# Patient Record
Sex: Female | Born: 1990 | Race: White | Hispanic: No | Marital: Single
Health system: Midwestern US, Community
[De-identification: ages and names within clinical notes are randomized; demographics above are authoritative.]

## PROBLEM LIST (undated history)

## (undated) DIAGNOSIS — D649 Anemia, unspecified: Secondary | ICD-10-CM

## (undated) HISTORY — PX: OVARIAN CYST REMOVAL: SHX89

---

## 2015-02-13 ENCOUNTER — Emergency Department
Admit: 2015-02-13 | Disposition: A | Discharge status: Home / Self Care | Payer: Self-pay | Admitting: Emergency Medicine

## 2015-07-08 ENCOUNTER — Emergency Department: Payer: No Typology Code available for payment source

## 2015-07-08 ENCOUNTER — Encounter: Payer: Self-pay | Admitting: Emergency Medicine

## 2015-07-08 ENCOUNTER — Emergency Department
Admission: EM | Admit: 2015-07-08 | Discharge: 2015-07-09 | Disposition: A | Discharge status: Home / Self Care | Payer: No Typology Code available for payment source | Attending: Emergency Medicine | Admitting: Emergency Medicine

## 2015-07-08 DIAGNOSIS — Y998 Other external cause status: Secondary | ICD-10-CM | POA: Insufficient documentation

## 2015-07-08 DIAGNOSIS — Y9389 Activity, other specified: Secondary | ICD-10-CM | POA: Diagnosis not present

## 2015-07-08 DIAGNOSIS — Z23 Encounter for immunization: Secondary | ICD-10-CM | POA: Insufficient documentation

## 2015-07-08 DIAGNOSIS — S199XXA Unspecified injury of neck, initial encounter: Secondary | ICD-10-CM | POA: Diagnosis not present

## 2015-07-08 DIAGNOSIS — Y92410 Unspecified street and highway as the place of occurrence of the external cause: Secondary | ICD-10-CM | POA: Diagnosis not present

## 2015-07-08 DIAGNOSIS — Z720 Tobacco use: Secondary | ICD-10-CM | POA: Diagnosis not present

## 2015-07-08 DIAGNOSIS — S4992XA Unspecified injury of left shoulder and upper arm, initial encounter: Secondary | ICD-10-CM | POA: Diagnosis present

## 2015-07-08 DIAGNOSIS — M795 Residual foreign body in soft tissue: Secondary | ICD-10-CM

## 2015-07-08 DIAGNOSIS — Z3202 Encounter for pregnancy test, result negative: Secondary | ICD-10-CM | POA: Insufficient documentation

## 2015-07-08 DIAGNOSIS — S80251A Superficial foreign body, right knee, initial encounter: Secondary | ICD-10-CM | POA: Insufficient documentation

## 2015-07-08 DIAGNOSIS — S40012A Contusion of left shoulder, initial encounter: Secondary | ICD-10-CM | POA: Insufficient documentation

## 2015-07-08 HISTORY — DX: Anemia, unspecified: D64.9

## 2015-07-08 LAB — POCT PREGNANCY, URINE: Preg Test, Ur: NEGATIVE

## 2015-07-08 MED ORDER — IBUPROFEN 800 MG PO TABS
800.0000 mg | ORAL_TABLET | Freq: Once | ORAL | Status: AC
  Administered 2015-07-08: 800 mg via ORAL
  Filled 2015-07-08: qty 1

## 2015-07-08 MED ORDER — TETANUS-DIPHTH-ACELL PERTUSSIS 5-2.5-18.5 LF-MCG/0.5 IM SUSP
0.5000 mL | Freq: Once | INTRAMUSCULAR | Status: AC
  Administered 2015-07-08: 0.5 mL via INTRAMUSCULAR
  Filled 2015-07-08: qty 0.5

## 2015-07-08 MED ORDER — IBUPROFEN 800 MG PO TABS: 800.0000 mg | ORAL_TABLET | Freq: Three times a day (TID) | ORAL | Status: AC | PRN

## 2015-07-08 MED ORDER — ONDANSETRON 4 MG PO TBDP
4.0000 mg | ORAL_TABLET | Freq: Once | ORAL | Status: AC
  Administered 2015-07-08: 4 mg via ORAL
  Filled 2015-07-08: qty 1

## 2015-07-08 MED ORDER — MORPHINE SULFATE (PF) 2 MG/ML IV SOLN
2.0000 mg | Freq: Once | INTRAVENOUS | Status: AC
  Administered 2015-07-08: 2 mg via INTRAMUSCULAR
  Filled 2015-07-08: qty 1

## 2015-07-08 MED ORDER — CEPHALEXIN 500 MG PO CAPS: 500.0000 mg | ORAL_CAPSULE | Freq: Three times a day (TID) | ORAL | Status: AC

## 2015-07-08 MED ORDER — OXYCODONE-ACETAMINOPHEN 5-325 MG PO TABS: 1.0000 | ORAL_TABLET | Freq: Four times a day (QID) | ORAL | Status: AC | PRN

## 2015-07-08 MED ORDER — MORPHINE SULFATE (PF) 2 MG/ML IV SOLN
2.0000 mg | Freq: Once | INTRAVENOUS | Status: DC
Start: 2015-07-08 — End: 2015-07-08

## 2015-07-08 MED ORDER — CYCLOBENZAPRINE HCL 10 MG PO TABS: 10.0000 mg | ORAL_TABLET | Freq: Three times a day (TID) | ORAL | Status: AC | PRN

## 2015-07-08 MED ORDER — CEPHALEXIN 500 MG PO CAPS
500.0000 mg | ORAL_CAPSULE | Freq: Once | ORAL | Status: AC
  Administered 2015-07-08: 500 mg via ORAL
  Filled 2015-07-08: qty 1
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
## 2020-11-17 DIAGNOSIS — O219 Vomiting of pregnancy, unspecified: Secondary | ICD-10-CM

## 2020-11-17 NOTE — ED Notes (Signed)
Patient ambulatory to triage with c/o N/V for the past two weeks as well as a cough. Patient states she is [redacted] weeks pregnant.

## 2020-11-17 NOTE — ED Provider Notes (Signed)
30 year old female presenting for persistent nausea and vomiting.  Reports being [redacted] weeks pregnant.  She is also had a couple days of nasal congestion and cough but otherwise feels well.  She is not vaccinated for COVID.  She tells me that Zofran has worked for her in the past but she has been shot away from it because of some studies regarding birth defects.    The history is provided by the patient and the spouse.   Vomiting   This is a new problem. The current episode started more than 1 week ago. The problem occurs 5 to 10 times per day. The problem has not changed since onset.The emesis has an appearance of stomach contents. There has been no fever. Associated symptoms include cough and URI. Risk factors include ill contacts.   Nausea   Associated symptoms include cough and URI.        History reviewed. No pertinent past medical history.    No past surgical history on file.      History reviewed. No pertinent family history.    Social History     Socioeconomic History   ??? Marital status: SINGLE     Spouse name: Not on file   ??? Number of children: Not on file   ??? Years of education: Not on file   ??? Highest education level: Not on file   Occupational History   ??? Not on file   Tobacco Use   ??? Smoking status: Not on file   ??? Smokeless tobacco: Not on file   Substance and Sexual Activity   ??? Alcohol use: Not on file   ??? Drug use: Not on file   ??? Sexual activity: Not on file   Other Topics Concern   ??? Not on file   Social History Narrative   ??? Not on file     Social Determinants of Health     Financial Resource Strain:    ??? Difficulty of Paying Living Expenses: Not on file   Food Insecurity:    ??? Worried About Running Out of Food in the Last Year: Not on file   ??? Ran Out of Food in the Last Year: Not on file   Transportation Needs:    ??? Lack of Transportation (Medical): Not on file   ??? Lack of Transportation (Non-Medical): Not on file   Physical Activity:    ??? Days of Exercise per Week: Not on file   ??? Minutes of  Exercise per Session: Not on file   Stress:    ??? Feeling of Stress : Not on file   Social Connections:    ??? Frequency of Communication with Friends and Family: Not on file   ??? Frequency of Social Gatherings with Friends and Family: Not on file   ??? Attends Religious Services: Not on file   ??? Active Member of Clubs or Organizations: Not on file   ??? Attends Archivist Meetings: Not on file   ??? Marital Status: Not on file   Intimate Partner Violence:    ??? Fear of Current or Ex-Partner: Not on file   ??? Emotionally Abused: Not on file   ??? Physically Abused: Not on file   ??? Sexually Abused: Not on file   Housing Stability:    ??? Unable to Pay for Housing in the Last Year: Not on file   ??? Number of Places Lived in the Last Year: Not on file   ??? Unstable Housing in the  Last Year: Not on file         ALLERGIES: Patient has no known allergies.    Review of Systems   Respiratory: Positive for cough.    Gastrointestinal: Positive for nausea and vomiting.   Psychiatric/Behavioral: The patient is hyperactive.    All other systems reviewed and are negative.      Vitals:    11/17/20 2206   BP: 115/79   Pulse: (!) 124   Resp: 18   Temp: 98.8 ??F (37.1 ??C)   SpO2: 99%   Weight: 58.1 kg (128 lb)   Height: '5\' 7"'  (1.702 m)            Physical Exam  Vitals and nursing note reviewed.   Constitutional:       Appearance: She is well-developed.   HENT:      Head: Normocephalic and atraumatic.   Eyes:      Conjunctiva/sclera: Conjunctivae normal.      Pupils: Pupils are equal, round, and reactive to light.   Cardiovascular:      Rate and Rhythm: Regular rhythm. Tachycardia present.   Pulmonary:      Effort: Pulmonary effort is normal.      Breath sounds: Normal breath sounds.   Abdominal:      General: Bowel sounds are normal.      Palpations: Abdomen is soft.   Musculoskeletal:         General: Normal range of motion.      Cervical back: Neck supple.   Skin:     General: Skin is warm and dry.   Neurological:      Mental Status: She is  alert and oriented to person, place, and time.   Psychiatric:         Speech: Speech is rapid and pressured.          MDM  Number of Diagnoses or Management Options  Complicated UTI (urinary tract infection)  Excessive vomiting in pregnancy  Diagnosis management comments: 30 year old female presenting for vomiting as well as URI type symptoms.  She was tachycardic upon arrival and during my exam.  We will check basic labs given that she reports vomiting for 70 days.  Treat with fluids and Zofran.  I am also going to swab for COVID if she is not vaccinated.    Have encouraged her to get vaccinated if her swab were to come back negative.  We discussed the adverse effects that have been reported in pregnancy secondary to acute COVID-19 infections.       Amount and/or Complexity of Data Reviewed  Clinical lab tests: ordered and reviewed (Results for orders placed or performed during the hospital encounter of 11/17/20  -COVID-19 RAPID TEST:        Result                      Value             Ref Range           Specimen source             NASAL SWAB                            COVID-19 rapid test         Not detected      NOTD           -URINALYSIS W/ RFLX MICROSCOPIC:  Result                      Value             Ref Range           Color                       AMBER                                 Appearance                  TURBID                                Specific gravity            1.031 (H)         1.001 - 1.02*       pH (UA)                     6.0               5.0 - 9.0           Protein                     100 (A)           NEG mg/dL           Glucose                     Negative          mg/dL               Ketone                      40 (A)            NEG mg/dL           Bilirubin                   SMALL (A)         NEG                 Blood                       Negative          NEG                 Urobilinogen                1.0               0.2 - 1.0 EU*       Nitrites                     Negative          NEG                 Leukocyte Esterase          SMALL (A)         NEG                 WBC  50-100            0 /hpf              RBC                         0-3               0 /hpf              Epithelial cells            10-20             0 /hpf              Bacteria                    4+ (H)            0 /hpf              Casts                       20-50             0 /lpf         -HCG URINE, QL:        Result                      Value             Ref Range           HCG urine, QL               Positive (A)      NEG            -CBC WITH AUTOMATED DIFF:        Result                      Value             Ref Range           WBC                         7.8               4.3 - 11.1 K*       RBC                         4.74              4.05 - 5.2 M*       HGB                         12.2              11.7 - 15.4 *       HCT                         37.8              35.8 - 46.3 %       MCV                         79.7  79.6 - 97.8 *       MCH                         25.7 (L)          26.1 - 32.9 *       MCHC                        32.3              31.4 - 35.0 *       RDW                         14.6              11.9 - 14.6 %       PLATELET                    284               150 - 450 K/*       MPV                         8.6 (L)           9.4 - 12.3 FL       ABSOLUTE NRBC               0.00              0.0 - 0.2 K/*       DF                          AUTOMATED                             NEUTROPHILS                 71                43 - 78 %           LYMPHOCYTES                 22                13 - 44 %           MONOCYTES                   7                 4.0 - 12.0 %        EOSINOPHILS                 0 (L)             0.5 - 7.8 %         BASOPHILS                   0                 0.0 - 2.0 %         IMMATURE GRANULOCYTES       0                 0.0 - 5.0 %  ABS. NEUTROPHILS            5.6               1.7 - 8.2 K/*       ABS. LYMPHOCYTES             1.7               0.5 - 4.6 K/*       ABS. MONOCYTES              0.5               0.1 - 1.3 K/*       ABS. EOSINOPHILS            0.0               0.0 - 0.8 K/*       ABS. BASOPHILS              0.0               0.0 - 0.2 K/*       ABS. IMM. GRANS.            0.0               0.0 - 0.5 K/*  -METABOLIC PANEL, COMPREHENSIVE:        Result                      Value             Ref Range           Sodium                      134 (L)           136 - 145 mm*       Potassium                   4.4               3.5 - 5.1 mm*       Chloride                    103               98 - 107 mmo*       CO2                         25                21 - 32 mmol*       Anion gap                   6 (L)             7 - 16 mmol/L       Glucose                     99                65 - 100 mg/*       BUN                         10  6 - 23 MG/DL        Creatinine                  0.55 (L)          0.6 - 1.0 MG*       GFR est AA                  >60               >60 ml/min/1*       GFR est non-AA              >60               >60 ml/min/1*       Calcium                     9.3               8.3 - 10.4 M*       Bilirubin, total            0.4               0.2 - 1.1 MG*       ALT (SGPT)                  65                12 - 65 U/L         AST (SGOT)                  33                15 - 37 U/L         Alk. phosphatase            53                50 - 136 U/L        Protein, total              7.6               6.3 - 8.2 g/*       Albumin                     3.8               3.5 - 5.0 g/*       Globulin                    3.8 (H)           2.3 - 3.5 g/*       A-G Ratio                   1.0 (L)           1.2 - 3.5      )    Risk of Complications, Morbidity, and/or Mortality  Presenting problems: high  Diagnostic procedures: moderate  Management options: moderate  General comments: Patient's remained hemodynamically stable.  Blood work is not consistent with any significant metabolic disarray, liver  disorder, or dehydration.  She is been eating almost throughout her stay.  COVID is negative.  Counseled patient on COVID-vaccine importance.  Providing Zofran for outpatient treatment.  Ceftriaxone here in the department home with Keflex.  Patient made  mention of already having an OB/GYN but I put the information for [facility] to her paperwork.    I personally reviewed the patient's vital signs, laboratory tests, and/or radiological findings.  I discussed these findings with the patient and their significance.  I answered all questions and gave the patient clear return precautions.  The patient was discharged from the emergency department in stable condition        Patient Progress  Patient progress: improved    ED Course as of 11/17/20 2343   Thu Nov 17, 2020   2338 Fortunately the patient's COVID swab is negative.  Blood work seems unremarkable.  Urinalysis consistent with infection.  Treating with ceftriaxone. [JS]   2342 Patient actively eating snacks in bed. [JS]      ED Course User Index  [JS] Synetta Fail, MD       Procedures

## 2020-11-18 ENCOUNTER — Inpatient Hospital Stay
Admit: 2020-11-18 | Discharge: 2020-11-18 | Disposition: A | Discharge status: Home / Self Care | Attending: Emergency Medicine

## 2020-11-18 LAB — CBC WITH AUTO DIFFERENTIAL
Basophils %: 0 % (ref 0.0–2.0)
Basophils Absolute: 0 10*3/uL (ref 0.0–0.2)
Eosinophils %: 0 % — ABNORMAL LOW (ref 0.5–7.8)
Eosinophils Absolute: 0 10*3/uL (ref 0.0–0.8)
Granulocyte Absolute Count: 0 10*3/uL (ref 0.0–0.5)
Hematocrit: 37.8 % (ref 35.8–46.3)
Hemoglobin: 12.2 g/dL (ref 11.7–15.4)
Immature Granulocytes: 0 % (ref 0.0–5.0)
Lymphocytes %: 22 % (ref 13–44)
Lymphocytes Absolute: 1.7 10*3/uL (ref 0.5–4.6)
MCH: 25.7 PG — ABNORMAL LOW (ref 26.1–32.9)
MCHC: 32.3 g/dL (ref 31.4–35.0)
MCV: 79.7 FL (ref 79.6–97.8)
MPV: 8.6 FL — ABNORMAL LOW (ref 9.4–12.3)
Monocytes %: 7 % (ref 4.0–12.0)
Monocytes Absolute: 0.5 10*3/uL (ref 0.1–1.3)
NRBC Absolute: 0 10*3/uL (ref 0.0–0.2)
Neutrophils %: 71 % (ref 43–78)
Neutrophils Absolute: 5.6 10*3/uL (ref 1.7–8.2)
Platelets: 284 10*3/uL (ref 150–450)
RBC: 4.74 M/uL (ref 4.05–5.2)
RDW: 14.6 % (ref 11.9–14.6)
WBC: 7.8 10*3/uL (ref 4.3–11.1)

## 2020-11-18 LAB — COMPREHENSIVE METABOLIC PANEL
ALT: 65 U/L (ref 12–65)
AST: 33 U/L (ref 15–37)
Albumin/Globulin Ratio: 1 — ABNORMAL LOW (ref 1.2–3.5)
Albumin: 3.8 g/dL (ref 3.5–5.0)
Alkaline Phosphatase: 53 U/L (ref 50–136)
Anion Gap: 6 mmol/L — ABNORMAL LOW (ref 7–16)
BUN: 10 MG/DL (ref 6–23)
CO2: 25 mmol/L (ref 21–32)
Calcium: 9.3 MG/DL (ref 8.3–10.4)
Chloride: 103 mmol/L (ref 98–107)
Creatinine: 0.55 MG/DL — ABNORMAL LOW (ref 0.6–1.0)
EGFR IF NonAfrican American: >60 mL/min/{1.73_m2} (ref >60)
GFR African American: >60 mL/min/{1.73_m2} (ref >60)
Globulin: 3.8 g/dL — ABNORMAL HIGH (ref 2.3–3.5)
Glucose: 99 mg/dL (ref 65–100)
Potassium: 4.4 mmol/L (ref 3.5–5.1)
Sodium: 134 mmol/L — ABNORMAL LOW (ref 136–145)
Total Bilirubin: 0.4 MG/DL (ref 0.2–1.1)
Total Protein: 7.6 g/dL (ref 6.3–8.2)

## 2020-11-18 LAB — HCG URINE, QL
HCG urine, QL: POSITIVE — AB
Pregnancy Test(Urn): POSITIVE — AB

## 2020-11-18 LAB — URINALYSIS W/ RFLX MICROSCOPIC
Blood, Urine: NEGATIVE
Blood: NEGATIVE
Glucose, Ur: NEGATIVE mg/dL
Glucose: NEGATIVE mg/dL
Ketone: 40 mg/dL — AB
Ketones, Urine: 40 mg/dL — AB
Nitrite, Urine: NEGATIVE
Nitrites: NEGATIVE
Protein, UA: 100 mg/dL — AB
Protein: 100 mg/dL — AB
Specific Gravity, UA: 1.031 — ABNORMAL HIGH (ref 1.001–1.023)
Specific gravity: 1.031 — ABNORMAL HIGH (ref 1.001–1.023)
Urobilinogen, UA, POCT: 1 EU/dL (ref 0.2–1.0)
Urobilinogen: 1 EU/dL (ref 0.2–1.0)
pH (UA): 6 (ref 5.0–9.0)
pH, UA: 6 (ref 5.0–9.0)

## 2020-11-18 LAB — COVID-19, RAPID: SARS-CoV-2, Rapid: NOT DETECTED

## 2020-11-18 LAB — CBC WITH AUTOMATED DIFF
ABS. BASOPHILS: 0 10*3/uL (ref 0.0–0.2)
ABS. EOSINOPHILS: 0 10*3/uL (ref 0.0–0.8)
ABS. IMM. GRANS.: 0 10*3/uL (ref 0.0–0.5)
ABS. LYMPHOCYTES: 1.7 10*3/uL (ref 0.5–4.6)
ABS. MONOCYTES: 0.5 10*3/uL (ref 0.1–1.3)
ABS. NEUTROPHILS: 5.6 10*3/uL (ref 1.7–8.2)
ABSOLUTE NRBC: 0 10*3/uL (ref 0.0–0.2)
BASOPHILS: 0 % (ref 0.0–2.0)
EOSINOPHILS: 0 % — ABNORMAL LOW (ref 0.5–7.8)
HCT: 37.8 % (ref 35.8–46.3)
HGB: 12.2 g/dL (ref 11.7–15.4)
IMMATURE GRANULOCYTES: 0 % (ref 0.0–5.0)
LYMPHOCYTES: 22 % (ref 13–44)
MCH: 25.7 PG — ABNORMAL LOW (ref 26.1–32.9)
MCHC: 32.3 g/dL (ref 31.4–35.0)
MCV: 79.7 FL (ref 79.6–97.8)
MONOCYTES: 7 % (ref 4.0–12.0)
MPV: 8.6 FL — ABNORMAL LOW (ref 9.4–12.3)
NEUTROPHILS: 71 % (ref 43–78)
PLATELET: 284 10*3/uL (ref 150–450)
RBC: 4.74 M/uL (ref 4.05–5.2)
RDW: 14.6 % (ref 11.9–14.6)
WBC: 7.8 10*3/uL (ref 4.3–11.1)

## 2020-11-18 LAB — METABOLIC PANEL, COMPREHENSIVE
A-G Ratio: 1 — ABNORMAL LOW (ref 1.2–3.5)
ALT (SGPT): 65 U/L (ref 12–65)
AST (SGOT): 33 U/L (ref 15–37)
Albumin: 3.8 g/dL (ref 3.5–5.0)
Alk. phosphatase: 53 U/L (ref 50–136)
Anion gap: 6 mmol/L — ABNORMAL LOW (ref 7–16)
BUN: 10 MG/DL (ref 6–23)
Bilirubin, total: 0.4 MG/DL (ref 0.2–1.1)
CO2: 25 mmol/L (ref 21–32)
Calcium: 9.3 MG/DL (ref 8.3–10.4)
Chloride: 103 mmol/L (ref 98–107)
Creatinine: 0.55 MG/DL — ABNORMAL LOW (ref 0.6–1.0)
GFR est AA: >60 mL/min/{1.73_m2} (ref >60)
GFR est non-AA: >60 mL/min/{1.73_m2} (ref >60)
Globulin: 3.8 g/dL — ABNORMAL HIGH (ref 2.3–3.5)
Glucose: 99 mg/dL (ref 65–100)
Potassium: 4.4 mmol/L (ref 3.5–5.1)
Protein, total: 7.6 g/dL (ref 6.3–8.2)
Sodium: 134 mmol/L — ABNORMAL LOW (ref 136–145)

## 2020-11-18 LAB — COVID-19 RAPID TEST: COVID-19 rapid test: NOT DETECTED

## 2020-11-18 MED ORDER — SODIUM CHLORIDE 0.9% BOLUS IV
0.9 % | Freq: Once | INTRAVENOUS | Status: AC
Start: 2020-11-18 — End: 2020-11-18
  Administered 2020-11-18: 04:00:00 via INTRAVENOUS

## 2020-11-18 MED ORDER — CEFTRIAXONE 1 GRAM SOLUTION FOR INJECTION
1 gram | INTRAMUSCULAR | Status: AC
Start: 2020-11-18 — End: 2020-11-18
  Administered 2020-11-18: 05:00:00 via INTRAVENOUS

## 2020-11-18 MED ORDER — ONDANSETRON 8 MG TAB, RAPID DISSOLVE
8 mg | ORAL | Status: AC
Start: 2020-11-18 — End: 2020-11-17
  Administered 2020-11-18: 04:00:00 via ORAL

## 2020-11-18 MED ORDER — ONDANSETRON 8 MG TAB, RAPID DISSOLVE
8 mg | ORAL_TABLET | Freq: Three times a day (TID) | ORAL | 0 refills | Status: AC | PRN
Start: 2020-11-18 — End: ?

## 2020-11-18 MED ORDER — CEPHALEXIN 500 MG CAP
500 mg | ORAL_CAPSULE | Freq: Three times a day (TID) | ORAL | 0 refills | Status: AC
Start: 2020-11-18 — End: 2020-11-27

## 2020-11-18 MED FILL — ONDANSETRON 8 MG TAB, RAPID DISSOLVE: 8 mg | ORAL | Qty: 1

## 2020-11-18 MED FILL — CEFTRIAXONE 1 GRAM SOLUTION FOR INJECTION: 1 gram | INTRAMUSCULAR | Qty: 1

## 2020-11-18 NOTE — ED Notes (Signed)
 I have reviewed discharge instructions with the patient.  The patient verbalized understanding.    Patient left ED via Discharge Method: ambulatory to Home with family.    Opportunity for questions and clarification provided.       Patient given 2 scripts.         To continue your aftercare when you leave the hospital, you may receive an automated call from our care team to check in on how you are doing.  This is a free service and part of our promise to provide the best care and service to meet your aftercare needs." If you have questions, or wish to unsubscribe from this service please call [phone].  Thank you for Choosing our [facility] Emergency Department.
